# Patient Record
Sex: Female | Born: 2000 | Race: White | Hispanic: No | Marital: Single | Smoking: Never smoker
Health system: Southern US, Community
[De-identification: ages and names within clinical notes are randomized; demographics above are authoritative.]

---
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
## 2022-06-27 ENCOUNTER — Ambulatory Visit (HOSPITAL_COMMUNITY)
Admission: EM | Admit: 2022-06-27 | Discharge: 2022-06-27 | Disposition: A | Discharge status: Home / Self Care | Payer: Medicaid Other | Attending: Family Medicine | Admitting: Family Medicine

## 2022-06-27 ENCOUNTER — Other Ambulatory Visit: Payer: Self-pay

## 2022-06-27 ENCOUNTER — Encounter (HOSPITAL_COMMUNITY): Payer: Self-pay | Admitting: *Deleted

## 2022-06-27 DIAGNOSIS — S0993XA Unspecified injury of face, initial encounter: Secondary | ICD-10-CM | POA: Diagnosis not present

## 2022-06-27 DIAGNOSIS — S00511A Abrasion of lip, initial encounter: Secondary | ICD-10-CM

## 2022-06-27 MED ORDER — MUPIROCIN 2 % EX OINT: 1.0000 | TOPICAL_OINTMENT | Freq: Two times a day (BID) | CUTANEOUS | 0 refills | Status: AC

## 2022-06-27 MED ORDER — IBUPROFEN 800 MG PO TABS: 800.0000 mg | ORAL_TABLET | Freq: Three times a day (TID) | ORAL | 0 refills | Status: AC | PRN

## 2022-06-27 NOTE — Discharge Instructions (Addendum)
Take ibuprofen 800 mg--1 tab every 8 hours as needed for pain.  Put mupirocin ointment on the cut on your lip until healed.  Ice the area.  Sleep with your head on a few extra pillows tonight and tomorrow night.  You can call your dentist that you use to see or I did call Dr. Leanord Asal on-call for [facility].  His phone number is [phone].  If he calls me back I will call you to let you know what he says to do.

## 2022-06-27 NOTE — ED Provider Notes (Addendum)
[facility]    CSN: [id number] Arrival date & time: 06/27/22  1242      History   Chief Complaint Chief Complaint  Patient presents with   Lip Laceration   Dental Problem    HPI Deanna Humphrey is a 21 y.o. female.   HPI Here for pain and swelling in her right upper lip and her right central upper incisor.  Today she and her family were working on their deck, and she stepped on a loose board.  It popped up and hit her on the upper lip.  She has some swelling and an abrasion on the right upper lip.  It also hit her right central incisor and it is now tucked behind her left central incisor.  It was not that way before this injury.  No allergies to medicines; she does take Prozac.  She has not seen her usual dentist in over 3 years.  No other injury.  She did not have any other blow to her head and did not lose consciousness.  Up-to-date on tetanus  History reviewed. No pertinent past medical history.  There are no problems to display for this patient.   History reviewed. No pertinent surgical history.  OB History   No obstetric history on file.      Home Medications    Prior to Admission medications   Medication Sig Start Date End Date Taking? Authorizing Provider  ibuprofen (ADVIL) 800 MG tablet Take 1 tablet (800 mg total) by mouth every 8 (eight) hours as needed (pain). 06/27/22  Yes Zenia Resides, MD  mupirocin ointment (BACTROBAN) 2 % Apply 1 Application topically 2 (two) times daily. To affected area till better 06/27/22  Yes Lova Urbieta, Janace Aris, MD    Family History History reviewed. No pertinent family history.  Social History Social History   Tobacco Use   Smoking status: Never   Smokeless tobacco: Never     Allergies   Nickel and Latex   Review of Systems Review of Systems   Physical Exam Triage Vital Signs ED Triage Vitals  Enc Vitals Group     BP 06/27/22 1402 105/64     Pulse Rate 06/27/22 1402 (!) 58     Resp 06/27/22  1402 18     Temp 06/27/22 1402 98.7 F (37.1 C)     Temp src --      SpO2 06/27/22 1402 98 %     Weight --      Height --      Head Circumference --      Peak Flow --      Pain Score 06/27/22 1359 4     Pain Loc --      Pain Edu? --      Excl. in GC? --    No data found.  Updated Vital Signs BP 105/64   Pulse (!) 58   Temp 98.7 F (37.1 C)   Resp 18   LMP 06/23/2022 (Approximate)   SpO2 98%   Visual Acuity Right Eye Distance:   Left Eye Distance:   Bilateral Distance:    Right Eye Near:   Left Eye Near:    Bilateral Near:     Physical Exam Vitals reviewed.  Constitutional:      General: She is not in acute distress.    Appearance: She is not toxic-appearing.  HENT:     Mouth/Throat:     Mouth: Mucous membranes are moist.     Comments: Some mild  swelling about 1.5 cm in diameter of her right central lip.  There is an abrasion on the vermilion part of the lip.  There is nothing that needs suturing.  There is no other laceration to the mucous membranes.  She is not tender over the dental ridge.  The right central incisor is not movable to palpation, but it is tucked just behind the left central incisors medial edge. Skin:    Coloration: Skin is not pale.  Neurological:     Mental Status: She is alert and oriented to person, place, and time.  Psychiatric:        Behavior: Behavior normal.      UC Treatments / Results  Labs (all labs ordered are listed, but only abnormal results are displayed) Labs Reviewed - No data to display  EKG   Radiology No results found.  Procedures Procedures (including critical care time)  Medications Ordered in UC Medications - No data to display  Initial Impression / Assessment and Plan / UC Course  I have reviewed the triage vital signs and the nursing notes.  Pertinent labs & imaging results that were available during my care of the patient were reviewed by me and considered in my medical decision making (see chart for  details).     I put in a call to Dr. Leanord Asal who is according to the list on call for general dentistry for [facility].  I left a voicemail.  I have given the patient his contact information, and she may end up calling her own dentist if he is available.  Ice and elevation for the lip.  Mupirocin to the wound.  Dr. Leanord Asal called back later and asked that the patient call him first thing Monday morning after I discussed with him the details of her visit and exam.  I let Mae Denunzio know that she needs to do that on Monday morning. Final Clinical Impressions(s) / UC Diagnoses   Final diagnoses:  Dental injury, initial encounter  Abrasion of lip, initial encounter     Discharge Instructions      Take ibuprofen 800 mg--1 tab every 8 hours as needed for pain.  Put mupirocin ointment on the cut on your lip until healed.  Ice the area.  Sleep with your head on a few extra pillows tonight and tomorrow night.  You can call your dentist that you use to see or I did call Dr. Leanord Asal on-call for [facility].  His phone number is [phone].  If he calls me back I will call you to let you know what he says to do.       ED Prescriptions     Medication Sig Dispense Auth. Provider   ibuprofen (ADVIL) 800 MG tablet Take 1 tablet (800 mg total) by mouth every 8 (eight) hours as needed (pain). 21 tablet Brain Honeycutt, Janace Aris, MD   mupirocin ointment (BACTROBAN) 2 % Apply 1 Application topically 2 (two) times daily. To affected area till better 22 g Marlinda Mike Janace Aris, MD      PDMP not reviewed this encounter.   Zenia Resides, MD 06/27/22 1456    Zenia Resides, MD 06/27/22 [phone]

## 2022-06-27 NOTE — ED Triage Notes (Signed)
T reports she stepped on a loose board today and the board poped up and hit her on the lip . Pt also reports front tooth is injured.
